# Patient Record
Sex: Female | Born: 1975 | Race: Asian | Hispanic: No | Marital: Single | Smoking: Never smoker
Health system: Southern US, Community
[De-identification: ages and names within clinical notes are randomized; demographics above are authoritative.]

## PROBLEM LIST (undated history)

## (undated) DIAGNOSIS — Z789 Other specified health status: Secondary | ICD-10-CM

## (undated) HISTORY — PX: ECTOPIC PREGNANCY SURGERY: SHX613

---

## 1998-06-12 ENCOUNTER — Other Ambulatory Visit: Admission: RE | Admit: 1998-06-12 | Discharge: 1998-06-12 | Payer: Self-pay | Admitting: *Deleted

## 1999-05-14 ENCOUNTER — Other Ambulatory Visit: Admission: RE | Admit: 1999-05-14 | Discharge: 1999-05-14 | Payer: Self-pay | Admitting: Orthopedic Surgery

## 2004-03-02 ENCOUNTER — Inpatient Hospital Stay (HOSPITAL_COMMUNITY): Admission: AD | Admit: 2004-03-02 | Discharge: 2004-03-02 | Payer: Self-pay | Admitting: *Deleted

## 2004-03-02 ENCOUNTER — Ambulatory Visit: Payer: Self-pay | Admitting: Family Medicine

## 2004-03-07 ENCOUNTER — Ambulatory Visit: Payer: Self-pay | Admitting: Family Medicine

## 2004-03-08 ENCOUNTER — Ambulatory Visit (HOSPITAL_COMMUNITY): Admission: RE | Admit: 2004-03-08 | Discharge: 2004-03-08 | Payer: Self-pay | Admitting: *Deleted

## 2004-03-21 ENCOUNTER — Ambulatory Visit: Payer: Self-pay | Admitting: Obstetrics & Gynecology

## 2004-04-04 ENCOUNTER — Ambulatory Visit: Payer: Self-pay | Admitting: *Deleted

## 2004-04-26 ENCOUNTER — Ambulatory Visit: Payer: Self-pay | Admitting: Family Medicine

## 2004-04-26 ENCOUNTER — Inpatient Hospital Stay (HOSPITAL_COMMUNITY): Admission: RE | Admit: 2004-04-26 | Discharge: 2004-05-02 | Payer: Self-pay | Admitting: *Deleted

## 2004-05-09 ENCOUNTER — Ambulatory Visit: Payer: Self-pay | Admitting: Obstetrics & Gynecology

## 2004-05-16 ENCOUNTER — Ambulatory Visit: Payer: Self-pay | Admitting: *Deleted

## 2004-05-30 ENCOUNTER — Ambulatory Visit: Payer: Self-pay | Admitting: *Deleted

## 2004-06-06 ENCOUNTER — Ambulatory Visit: Payer: Self-pay | Admitting: *Deleted

## 2004-06-06 ENCOUNTER — Inpatient Hospital Stay (HOSPITAL_COMMUNITY): Admission: RE | Admit: 2004-06-06 | Discharge: 2004-06-06 | Payer: Self-pay | Admitting: Obstetrics & Gynecology

## 2004-06-13 ENCOUNTER — Ambulatory Visit: Payer: Self-pay | Admitting: *Deleted

## 2004-06-20 ENCOUNTER — Ambulatory Visit: Payer: Self-pay | Admitting: Obstetrics and Gynecology

## 2004-06-20 ENCOUNTER — Encounter (INDEPENDENT_AMBULATORY_CARE_PROVIDER_SITE_OTHER): Payer: Self-pay | Admitting: *Deleted

## 2004-06-20 ENCOUNTER — Inpatient Hospital Stay (HOSPITAL_COMMUNITY): Admission: AD | Admit: 2004-06-20 | Discharge: 2004-06-22 | Payer: Self-pay | Admitting: Obstetrics and Gynecology

## 2006-12-08 ENCOUNTER — Ambulatory Visit (HOSPITAL_COMMUNITY): Admission: RE | Admit: 2006-12-08 | Discharge: 2006-12-08 | Payer: Self-pay | Admitting: Obstetrics and Gynecology

## 2010-08-17 NOTE — Discharge Summary (Signed)
NAMEPAIGE, Lydia Williams                    ACCOUNT NO.:  [id number]   MEDICAL RECORD NO.:  [id number]          PATIENT TYPE:  WOC   LOCATION:  WOC                          FACILITY:  [facility]   PHYSICIAN:  Jon Gills, M.D.  DATE OF BIRTH:  Dec 26, 1975   DATE OF ADMISSION:  04/26/2004  DATE OF DISCHARGE:  05/02/2004                                 DISCHARGE SUMMARY   ADMISSION DIAGNOSES:  39.  A 35 year old G1 at 27-1/7 weeks with cervical effacement and dilation      on ultrasound.  2.  Reassuring felt heart tracing x2.  3.  Twin gestation.  4.  History of GBS bacteria.  5.  Failed one hour GCT with normal glycemia after betamethasone.   DISCHARGE DIAGNOSES:  62.  A 35 year old G1 at 28-0/7 weeks with twin gestation.  2.  Improved cervical exam with bedrest, antibiotics and tocolytics.  3.  Reassuring fetal heart tracing x2.  4.  Failed one hour GCT with normal glycemia after betamethasone.   DISCHARGE MEDICATIONS:  1.  Prenatal vitamins one p.o. daily.  2.  Procardia XL 30 mg one p.o. b.i.d.   ADMISSION HISTORY:  Ms. Schwartzman was admitted after an ultrasound for growth  showed a cervical length of 0.9 cm with 2.7 cm dilation.  She is admitted  for bedrest, antibiotics, tocolytics and betamethasone.   HOSPITAL COURSE:  Ms. Nisley was initially put on magnesium and had occasional  contractions, mostly in the evening.  She had no cervical change throughout  her hospitalization.  Magnesium was discontinued after three days, and she  was switched to Procardia.  Again, her cervical exam did not become more  dilated or effaced.  After six days of Unasyn, this was discontinued.  She  remained stable up until her discharge.   DISCHARGE INSTRUCTIONS:  The patient was told of her above medical regimen.  She will need to be on strict bed rest and pelvic rest.  She has an  appointment with [facility] in one week.  At that time, she will need  a three hour GTT.     LC/MEDQ  D:  05/02/2004  T:   05/02/2004  Job:  [id number]   cc:   Lesly Dukes, M.D.   [facility], [facility]

## 2010-08-17 NOTE — Op Note (Signed)
NAMEMAELEE, Lydia Williams                    ACCOUNT NO.:  [id number]   MEDICAL RECORD NO.:  [id number]          PATIENT TYPE:  INP   LOCATION:  9373                          [facility]ILITY[facility]   PHYSICIAN:  Phil D. Okey Dupre, M.D.     DATE OF BIRTH:  08-26-1975   DATE OF PROCEDURE:  06/20/2004  DATE OF DISCHARGE:                                 OPERATIVE REPORT   PROCEDURES:  1.  Examination under anesthesia.  2.  Drainage of labial hematoma.  3.  Repair of vaginal laceration.   PREOPERATIVE DIAGNOSIS:  Vaginal laceration and right labial hematoma.   POSTOPERATIVE DIAGNOSIS:  Vaginal laceration and right labial hematoma.   ANESTHESIA:  General.   ESTIMATED BLOOD LOSS:  100 mL.   POSTOPERATIVE CONDITION:  Satisfactory.   SURGEON:  Javier Glazier. Okey Dupre, M.D.   OPERATIVE FINDINGS:  The patient, who had delivered twins without anesthesia  and continued to bleed.  Examination and repair of a vaginal laceration was  unsatisfactory because of the movement and discomfort of the patient, who  continued to bleed after an attempt using local anesthesia to control the  laceration and bleeding.  The patient was taken to the operating room,  examined under general anesthesia, as we did not have a platelet count  available as yet, and she was found to have a right labial tear inside the  hymenal ring with formation of a hematoma in the right labia that probably  had 100-150 mL of blood in it, and a first degree perineal laceration, which  was repaired but reopened when we were doing the examination to make sure  there was no other laceration to the cervix, and the rest of the vagina  seemed normal.  She had two small paraurethral lacerations that were not  bleeding.   Using a finger, the hematoma was evacuated and a deep figure-of-eight used  to control the deep bleeding in the laceration on the right side and then  run with a continuous running 2-0 chromic catgut suture.  This was also done  with respect to the  first degree oozing perineal laceration.  It was closed  as a usual episiotomy closure.  There was very little bleeding at the end of  the procedure, however not much clotting.  A DIC panel was drawn prior to  the surgery and is pending, and the patient will continue to be observed.      PDR/MEDQ  D:  06/20/2004  T:  06/21/2004  Job:  [id number]

## 2010-09-06 ENCOUNTER — Other Ambulatory Visit: Payer: Self-pay | Admitting: Obstetrics and Gynecology

## 2010-09-06 ENCOUNTER — Other Ambulatory Visit (HOSPITAL_COMMUNITY)
Admission: RE | Admit: 2010-09-06 | Discharge: 2010-09-06 | Disposition: A | Discharge status: Home / Self Care | Payer: BC Managed Care – PPO | Source: Ambulatory Visit | Attending: Obstetrics and Gynecology | Admitting: Obstetrics and Gynecology

## 2010-09-06 DIAGNOSIS — Z01419 Encounter for gynecological examination (general) (routine) without abnormal findings: Secondary | ICD-10-CM | POA: Insufficient documentation

## 2010-09-06 DIAGNOSIS — Z113 Encounter for screening for infections with a predominantly sexual mode of transmission: Secondary | ICD-10-CM | POA: Insufficient documentation

## 2010-10-17 ENCOUNTER — Other Ambulatory Visit: Payer: Self-pay | Admitting: Obstetrics and Gynecology

## 2010-10-17 DIAGNOSIS — O09529 Supervision of elderly multigravida, unspecified trimester: Secondary | ICD-10-CM

## 2010-10-17 DIAGNOSIS — O280 Abnormal hematological finding on antenatal screening of mother: Secondary | ICD-10-CM

## 2010-10-26 ENCOUNTER — Encounter (HOSPITAL_COMMUNITY): Payer: Self-pay

## 2010-10-26 ENCOUNTER — Other Ambulatory Visit: Payer: Self-pay | Admitting: Obstetrics and Gynecology

## 2010-10-26 ENCOUNTER — Ambulatory Visit (HOSPITAL_COMMUNITY): Admission: RE | Admit: 2010-10-26 | Payer: BC Managed Care – PPO | Source: Ambulatory Visit

## 2010-10-26 ENCOUNTER — Ambulatory Visit (HOSPITAL_COMMUNITY)
Admission: RE | Admit: 2010-10-26 | Discharge: 2010-10-26 | Disposition: A | Discharge status: Home / Self Care | Payer: BC Managed Care – PPO | Source: Ambulatory Visit | Attending: Obstetrics and Gynecology | Admitting: Obstetrics and Gynecology

## 2010-10-26 DIAGNOSIS — O280 Abnormal hematological finding on antenatal screening of mother: Secondary | ICD-10-CM

## 2010-10-26 DIAGNOSIS — O351XX0 Maternal care for (suspected) chromosomal abnormality in fetus, not applicable or unspecified: Secondary | ICD-10-CM | POA: Insufficient documentation

## 2010-10-26 DIAGNOSIS — O289 Unspecified abnormal findings on antenatal screening of mother: Secondary | ICD-10-CM | POA: Insufficient documentation

## 2010-10-26 DIAGNOSIS — O09529 Supervision of elderly multigravida, unspecified trimester: Secondary | ICD-10-CM

## 2010-10-26 DIAGNOSIS — O3510X0 Maternal care for (suspected) chromosomal abnormality in fetus, unspecified, not applicable or unspecified: Secondary | ICD-10-CM | POA: Insufficient documentation

## 2010-10-26 DIAGNOSIS — Z3689 Encounter for other specified antenatal screening: Secondary | ICD-10-CM | POA: Insufficient documentation

## 2010-10-31 ENCOUNTER — Other Ambulatory Visit: Payer: Self-pay

## 2010-11-16 ENCOUNTER — Ambulatory Visit (HOSPITAL_COMMUNITY): Payer: BC Managed Care – PPO | Attending: Obstetrics and Gynecology

## 2010-11-22 ENCOUNTER — Telehealth (HOSPITAL_COMMUNITY): Payer: Self-pay | Admitting: MS"

## 2010-11-22 NOTE — Telephone Encounter (Signed)
Chromosome analysis on products of conception from patient's previous pregnancy indicated Trisomy 13 (47,XX,+13). Discussed results with patient. Gender not disclosed. Discussed that recurrence risk for future pregnancy would be patient's age-related risk for aneuploidy, which at age 35 is approximately 1%. I discussed that screening and testing options would be available to her in a future pregnancy for chromosome conditions. Patient stated that she understood this but is worried that something will go wrong in the next pregnancy. She is not sure if she wants to try again for another pregnancy since this has been difficult.   Ms. Brienza stated that she has had no physical problems. I asked if she was aware that she missed her post-op appointment last week. She stated that she went back to her OB.   Ms. Parkin said that she has good support around her. I offered her information on Heartstrings, Pregnancy and Infant Loss support group. She declined this information. Encouraged her to call if we could help her in any way.

## 2011-01-15 ENCOUNTER — Encounter (HOSPITAL_COMMUNITY): Payer: Self-pay | Admitting: MS"

## 2011-03-06 ENCOUNTER — Other Ambulatory Visit: Payer: Self-pay

## 2014-01-31 ENCOUNTER — Encounter (HOSPITAL_COMMUNITY): Payer: Self-pay | Admitting: MS"

## 2014-02-07 ENCOUNTER — Other Ambulatory Visit: Payer: Self-pay | Admitting: Obstetrics and Gynecology

## 2014-02-08 LAB — CYTOLOGY - PAP

## 2014-02-09 ENCOUNTER — Other Ambulatory Visit (HOSPITAL_COMMUNITY): Payer: Self-pay | Admitting: Obstetrics and Gynecology

## 2014-02-09 DIAGNOSIS — O09521 Supervision of elderly multigravida, first trimester: Secondary | ICD-10-CM

## 2014-02-09 DIAGNOSIS — O09292 Supervision of pregnancy with other poor reproductive or obstetric history, second trimester: Secondary | ICD-10-CM

## 2014-04-01 NOTE — L&D Delivery Note (Signed)
SVD of VMI at 1324 on 09/12/14.  EBL 200cc.  APGARs 9,10.  Placenta to L&D. Head delivered LOA.  Body followed atraumatically.  Cord was clamped, cut and baby to abdomen.  Cord blood was obtained.  Placenta delivered S/I/3VC.  Fundus was firmed with pitocin and massage.  1st degree perineal lac was repaired with 3-0 Rapide in the normal fashion.  Mom and baby stable.  Mitchel Honour, DO

## 2014-04-19 ENCOUNTER — Encounter (HOSPITAL_COMMUNITY): Payer: Self-pay

## 2014-04-19 ENCOUNTER — Ambulatory Visit (HOSPITAL_COMMUNITY)
Admission: RE | Admit: 2014-04-19 | Discharge: 2014-04-19 | Disposition: A | Discharge status: Home / Self Care | Payer: BLUE CROSS/BLUE SHIELD | Source: Ambulatory Visit | Attending: Obstetrics and Gynecology | Admitting: Obstetrics and Gynecology

## 2014-04-19 DIAGNOSIS — Z36 Encounter for antenatal screening of mother: Secondary | ICD-10-CM | POA: Diagnosis present

## 2014-04-19 DIAGNOSIS — O09521 Supervision of elderly multigravida, first trimester: Secondary | ICD-10-CM

## 2014-04-19 DIAGNOSIS — O09212 Supervision of pregnancy with history of pre-term labor, second trimester: Secondary | ICD-10-CM | POA: Diagnosis not present

## 2014-04-19 DIAGNOSIS — O09529 Supervision of elderly multigravida, unspecified trimester: Secondary | ICD-10-CM | POA: Insufficient documentation

## 2014-04-19 DIAGNOSIS — Z3A18 18 weeks gestation of pregnancy: Secondary | ICD-10-CM | POA: Diagnosis not present

## 2014-04-19 DIAGNOSIS — O09292 Supervision of pregnancy with other poor reproductive or obstetric history, second trimester: Secondary | ICD-10-CM | POA: Insufficient documentation

## 2014-04-19 DIAGNOSIS — O09522 Supervision of elderly multigravida, second trimester: Secondary | ICD-10-CM | POA: Insufficient documentation

## 2014-04-19 DIAGNOSIS — O09299 Supervision of pregnancy with other poor reproductive or obstetric history, unspecified trimester: Secondary | ICD-10-CM | POA: Insufficient documentation

## 2014-04-19 DIAGNOSIS — Z3689 Encounter for other specified antenatal screening: Secondary | ICD-10-CM | POA: Insufficient documentation

## 2014-04-20 ENCOUNTER — Other Ambulatory Visit (HOSPITAL_COMMUNITY): Payer: Self-pay

## 2014-08-19 LAB — OB RESULTS CONSOLE GBS: STREP GROUP B AG: NEGATIVE

## 2014-09-12 ENCOUNTER — Encounter (HOSPITAL_COMMUNITY): Payer: Self-pay

## 2014-09-12 ENCOUNTER — Inpatient Hospital Stay (HOSPITAL_COMMUNITY)
Admission: AD | Admit: 2014-09-12 | Discharge: 2014-09-14 | DRG: 775 | Disposition: A | Discharge status: Home / Self Care | Payer: BLUE CROSS/BLUE SHIELD | Source: Ambulatory Visit | Attending: Obstetrics & Gynecology | Admitting: Obstetrics & Gynecology

## 2014-09-12 DIAGNOSIS — IMO0001 Reserved for inherently not codable concepts without codable children: Secondary | ICD-10-CM

## 2014-09-12 DIAGNOSIS — O09523 Supervision of elderly multigravida, third trimester: Secondary | ICD-10-CM | POA: Diagnosis not present

## 2014-09-12 DIAGNOSIS — Z3A38 38 weeks gestation of pregnancy: Secondary | ICD-10-CM | POA: Diagnosis present

## 2014-09-12 DIAGNOSIS — Z3490 Encounter for supervision of normal pregnancy, unspecified, unspecified trimester: Secondary | ICD-10-CM

## 2014-09-12 HISTORY — DX: Other specified health status: Z78.9

## 2014-09-12 LAB — TYPE AND SCREEN
ABO/RH(D): O POS
Antibody Screen: NEGATIVE

## 2014-09-12 LAB — CBC
HCT: 38.3 % (ref 36.0–46.0)
Hemoglobin: 13 g/dL (ref 12.0–15.0)
MCH: 31.3 pg (ref 26.0–34.0)
MCHC: 33.9 g/dL (ref 30.0–36.0)
MCV: 92.1 fL (ref 78.0–100.0)
PLATELETS: 165 10*3/uL (ref 150–400)
RBC: 4.16 MIL/uL (ref 3.87–5.11)
RDW: 14.4 % (ref 11.5–15.5)
WBC: 9.4 10*3/uL (ref 4.0–10.5)

## 2014-09-12 LAB — ABO/RH: ABO/RH(D): O POS

## 2014-09-12 MED ORDER — IBUPROFEN 600 MG PO TABS
600.0000 mg | ORAL_TABLET | Freq: Four times a day (QID) | ORAL | Status: DC
  Administered 2014-09-12 – 2014-09-14 (×8): 600 mg via ORAL
  Filled 2014-09-12 (×10): qty 1

## 2014-09-12 MED ORDER — ZOLPIDEM TARTRATE 5 MG PO TABS: 5.0000 mg | ORAL_TABLET | Freq: Every evening | ORAL | Status: DC | PRN

## 2014-09-12 MED ORDER — SIMETHICONE 80 MG PO CHEW: 80.0000 mg | CHEWABLE_TABLET | ORAL | Status: DC | PRN

## 2014-09-12 MED ORDER — ACETAMINOPHEN 325 MG PO TABS: 650.0000 mg | ORAL_TABLET | ORAL | Status: DC | PRN

## 2014-09-12 MED ORDER — DIBUCAINE 1 % RE OINT: 1.0000 "application " | TOPICAL_OINTMENT | RECTAL | Status: DC | PRN

## 2014-09-12 MED ORDER — BENZOCAINE-MENTHOL 20-0.5 % EX AERO
1.0000 "application " | INHALATION_SPRAY | CUTANEOUS | Status: DC | PRN
  Administered 2014-09-12 – 2014-09-14 (×2): 1 via TOPICAL
  Filled 2014-09-12 (×2): qty 56

## 2014-09-12 MED ORDER — OXYCODONE-ACETAMINOPHEN 5-325 MG PO TABS: 2.0000 | ORAL_TABLET | ORAL | Status: DC | PRN

## 2014-09-12 MED ORDER — LIDOCAINE HCL (PF) 1 % IJ SOLN
30.0000 mL | INTRAMUSCULAR | Status: AC | PRN
  Administered 2014-09-12: 30 mL via SUBCUTANEOUS
  Filled 2014-09-12: qty 30

## 2014-09-12 MED ORDER — TETANUS-DIPHTH-ACELL PERTUSSIS 5-2.5-18.5 LF-MCG/0.5 IM SUSP: 0.5000 mL | Freq: Once | INTRAMUSCULAR | Status: DC

## 2014-09-12 MED ORDER — OXYTOCIN BOLUS FROM INFUSION: 500.0000 mL | INTRAVENOUS | Status: DC

## 2014-09-12 MED ORDER — WITCH HAZEL-GLYCERIN EX PADS: 1.0000 "application " | MEDICATED_PAD | CUTANEOUS | Status: DC | PRN

## 2014-09-12 MED ORDER — PRENATAL MULTIVITAMIN CH
1.0000 | ORAL_TABLET | Freq: Every day | ORAL | Status: DC
  Administered 2014-09-13 – 2014-09-14 (×2): 1 via ORAL
  Filled 2014-09-12 (×2): qty 1

## 2014-09-12 MED ORDER — LACTATED RINGERS IV SOLN: 500.0000 mL | INTRAVENOUS | Status: DC | PRN

## 2014-09-12 MED ORDER — EPHEDRINE 5 MG/ML INJ
10.0000 mg | INTRAVENOUS | Status: DC | PRN
  Filled 2014-09-12: qty 2

## 2014-09-12 MED ORDER — FLEET ENEMA 7-19 GM/118ML RE ENEM: 1.0000 | ENEMA | RECTAL | Status: DC | PRN

## 2014-09-12 MED ORDER — ONDANSETRON HCL 4 MG/2ML IJ SOLN: 4.0000 mg | INTRAMUSCULAR | Status: DC | PRN

## 2014-09-12 MED ORDER — PHENYLEPHRINE 40 MCG/ML (10ML) SYRINGE FOR IV PUSH (FOR BLOOD PRESSURE SUPPORT)
80.0000 ug | PREFILLED_SYRINGE | INTRAVENOUS | Status: DC | PRN
  Filled 2014-09-12: qty 2

## 2014-09-12 MED ORDER — FENTANYL 2.5 MCG/ML BUPIVACAINE 1/10 % EPIDURAL INFUSION (WH - ANES): 14.0000 mL/h | INTRAMUSCULAR | Status: DC | PRN

## 2014-09-12 MED ORDER — DIPHENHYDRAMINE HCL 25 MG PO CAPS: 25.0000 mg | ORAL_CAPSULE | Freq: Four times a day (QID) | ORAL | Status: DC | PRN

## 2014-09-12 MED ORDER — OXYTOCIN 40 UNITS IN LACTATED RINGERS INFUSION - SIMPLE MED
62.5000 mL/h | INTRAVENOUS | Status: DC
  Administered 2014-09-12: 62.5 mL/h via INTRAVENOUS
  Filled 2014-09-12 (×2): qty 1000

## 2014-09-12 MED ORDER — ONDANSETRON HCL 4 MG/2ML IJ SOLN: 4.0000 mg | Freq: Four times a day (QID) | INTRAMUSCULAR | Status: DC | PRN

## 2014-09-12 MED ORDER — OXYCODONE-ACETAMINOPHEN 5-325 MG PO TABS
2.0000 | ORAL_TABLET | ORAL | Status: DC | PRN
Start: 2014-09-12 — End: 2014-09-12

## 2014-09-12 MED ORDER — OXYCODONE-ACETAMINOPHEN 5-325 MG PO TABS: 1.0000 | ORAL_TABLET | ORAL | Status: DC | PRN

## 2014-09-12 MED ORDER — CITRIC ACID-SODIUM CITRATE 334-500 MG/5ML PO SOLN: 30.0000 mL | ORAL | Status: DC | PRN

## 2014-09-12 MED ORDER — LANOLIN HYDROUS EX OINT: TOPICAL_OINTMENT | CUTANEOUS | Status: DC | PRN

## 2014-09-12 MED ORDER — LACTATED RINGERS IV SOLN: INTRAVENOUS | Status: DC

## 2014-09-12 MED ORDER — ONDANSETRON HCL 4 MG PO TABS: 4.0000 mg | ORAL_TABLET | ORAL | Status: DC | PRN

## 2014-09-12 MED ORDER — OXYCODONE-ACETAMINOPHEN 5-325 MG PO TABS
1.0000 | ORAL_TABLET | ORAL | Status: DC | PRN
  Administered 2014-09-12 (×2): 1 via ORAL
  Filled 2014-09-12 (×2): qty 1

## 2014-09-12 MED ORDER — SENNOSIDES-DOCUSATE SODIUM 8.6-50 MG PO TABS
2.0000 | ORAL_TABLET | ORAL | Status: DC
  Administered 2014-09-12 – 2014-09-14 (×2): 2 via ORAL
  Filled 2014-09-12 (×2): qty 2

## 2014-09-12 MED ORDER — DIPHENHYDRAMINE HCL 50 MG/ML IJ SOLN: 12.5000 mg | INTRAMUSCULAR | Status: DC | PRN

## 2014-09-12 NOTE — Progress Notes (Signed)
Called Heather RN BS Charge with report received room assignment of 160.

## 2014-09-12 NOTE — MAU Note (Signed)
Notified Dr. Mitchel Honour patient G5P3 [redacted]w[redacted]d, labor cervical exam 4.5/80/-3 bulging membranes, onset earlier this morning, some bloody show, Ucs every 5 minutes strong, patient considering epidural, received admit orders.

## 2014-09-12 NOTE — H&P (Signed)
Lydia Williams is a 39 y.o. female presenting for labor.  CTX started this AM between 6 and 7.  No LOF.  Bloody show.  Active FM.  Antepartum course complicated by AMA with h/o Tri 13; Panorama wnl.  GBS negative.  Maternal Medical History:  Reason for admission: Contractions.   Contractions: Onset was 3-5 hours ago.   Frequency: regular.   Perceived severity is moderate.    Fetal activity: Perceived fetal activity is normal.   Last perceived fetal movement was within the past hour.    Prenatal complications: no prenatal complications Prenatal Complications - Diabetes: none.    OB History    Gravida Para Term Preterm AB TAB SAB Ectopic Multiple Living   5 2 1 1 1  0 0 1 1 3      Past Medical History  Diagnosis Date  . Medical history non-contributory    Past Surgical History  Procedure Laterality Date  . Ectopic pregnancy surgery     Family History: family history is not on file. Social History:  reports that she has never smoked. She does not have any smokeless tobacco history on file. She reports that she does not drink alcohol or use illicit drugs.   Prenatal Transfer Tool  Maternal Diabetes: No Genetic Screening: Normal Maternal Ultrasounds/Referrals: Normal Fetal Ultrasounds or other Referrals:  None Maternal Substance Abuse:  No Significant Maternal Medications:  None Significant Maternal Lab Results:  Lab values include: Group B Strep negative Other Comments:  None  ROS  Dilation: 4.5 Effacement (%): 80 Station: -3 Exam by:: Lydia Reamer RN Blood pressure 111/69, pulse 84, temperature 98 F (36.7 C), resp. rate 16, height 5\' 4"  (1.626 m), weight 181 lb 9.6 oz (82.373 kg), last menstrual period 11/03/2013, unknown if currently breastfeeding. Maternal Exam:  Uterine Assessment: Contraction strength is moderate.  Contraction frequency is regular.   Abdomen: Fundal height is c/w dates.   Estimated fetal weight is 7#8.       Physical Exam  Constitutional: She is  oriented to person, place, and time. She appears well-developed and well-nourished.  GI: Soft. There is no rebound and no guarding.  Neurological: She is alert and oriented to person, place, and time.  Skin: Skin is warm and dry.  Psychiatric: She has a normal mood and affect. Her behavior is normal.    Prenatal labs: ABO, Rh:   Antibody:   Rubella:   RPR:    HBsAg:    HIV:    GBS: Negative (05/20 0000)   Assessment/Plan: 75HY [id number] at [redacted]w[redacted]d with labor -Anticipate NSVD -Epidural if desired   Lydia Williams 09/12/2014, 10:34 AM

## 2014-09-13 LAB — CBC
HCT: 36 % (ref 36.0–46.0)
Hemoglobin: 12.1 g/dL (ref 12.0–15.0)
MCH: 30.9 pg (ref 26.0–34.0)
MCHC: 33.6 g/dL (ref 30.0–36.0)
MCV: 91.8 fL (ref 78.0–100.0)
PLATELETS: 148 10*3/uL — AB (ref 150–400)
RBC: 3.92 MIL/uL (ref 3.87–5.11)
RDW: 14.5 % (ref 11.5–15.5)
WBC: 11.4 10*3/uL — ABNORMAL HIGH (ref 4.0–10.5)

## 2014-09-13 LAB — RPR: RPR: NONREACTIVE
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
## 2014-09-13 NOTE — Progress Notes (Signed)
Post Partum Day 1 Subjective: no complaints, up ad lib, voiding and tolerating PO  Objective: Blood pressure 117/64, pulse 70, temperature 98.1 F (36.7 C), temperature source Oral, resp. rate 16, height 5\' 4"  (1.626 m), weight 181 lb 9.6 oz (82.373 kg), last menstrual period 11/03/2013, SpO2 98 %, unknown if currently breastfeeding.  Physical Exam:  General: alert and cooperative Lochia: appropriate Uterine Fundus: firm Incision: healing well DVT Evaluation: No evidence of DVT seen on physical exam. Negative Homan's sign. No cords or calf tenderness. No significant calf/ankle edema.   Recent Labs  09/12/14 1005 09/13/14 0500  HGB 13.0 12.1  HCT 38.3 36.0    Assessment/Plan: Plan for discharge tomorrow Does not desire circ  LOS: 1 day   Zephyr Sausedo G 09/13/2014, 8:05 AM

## 2014-09-13 NOTE — Lactation Note (Addendum)
This note was copied from the chart of Lydia Williams. Lactation Consultation Note First time BF mom has 3 other children but didn't BF them. Mom has large everted nipples, baby tends to slide down to just on nipple from deep latch frequently. Reviewed w/mom positions, stated RN has helped her a lot w/BF so far. Romilda Garret RN has been working w/mom and supporting her to BF and not give as much formula. Mom wants to supplement as well. Discussed how supplementing can cut back her milk supply and to offer breast first.  Mom states baby is cluster feeding, explained reasoning of cluster feeding. Encouraged mom to massage breast during BF to assist in expressing colostrum so baby will get more. Mom encouraged to feed baby 8-12 times/24 hours and with feeding cues. Mom encouraged to waken baby for feeds. Mom encouraged to do skin-to-skin.Referred to Baby and Me Book in Breastfeeding section Pg. 22-23 for position options and Proper latch demonstration.[facility] brochure given w/resources, support groups and LC services. Educated about newborn behavior.  Noted during suck assessment while mom went to bathroom, baby had cried noted a white vertical line in upper hard palate. On assessment the line has a hard vertical ridge. Noted upper lip labial frenulum and tongue has limited movement. Has coorrdinated suck. Reported to nursery RN. Patient Name: Lydia Williams TIRWE'R Date: 09/13/2014 Reason for consult: Initial assessment   Maternal Data  Has patient been taught Hand Expression?: Yes Does the patient have breastfeeding experience prior to this delivery?: No  Feeding Feeding Type: Breast Fed Length of feed: 5 min (still BF)  LATCH Score/Interventions Latch: Repeated attempts needed to sustain latch, nipple held in mouth throughout feeding, stimulation needed to elicit sucking reflex. Intervention(s): Assist with latch;Breast massage;Breast compression  Audible Swallowing: A few with  stimulation Intervention(s): Skin to skin;Hand expression;Alternate breast massage  Type of Nipple: Everted at rest and after stimulation  Comfort (Breast/Nipple): Soft / non-tender     Hold (Positioning): Assistance needed to correctly position infant at breast and maintain latch. Intervention(s): Breastfeeding basics reviewed;Support Pillows;Position options;Skin to skin  LATCH Score: 7  Lactation Tools Discussed/Used     Consult Status Consult Status: Follow-up Date: 09/14/14 Follow-up type: In-patient    Charyl Dancer 09/13/2014, 5:52 PM

## 2014-09-14 MED ORDER — IBUPROFEN 600 MG PO TABS
600.0000 mg | ORAL_TABLET | Freq: Four times a day (QID) | ORAL | Status: AC
Start: 2014-09-14 — End: ?
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
## 2014-09-14 NOTE — Discharge Summary (Signed)
Obstetric Discharge Summary Reason for Admission: onset of labor Prenatal Procedures: ultrasound Intrapartum Procedures: spontaneous vaginal delivery Postpartum Procedures: none Complications-Operative and Postpartum: 1 degree perineal laceration HEMOGLOBIN  Date Value Ref Range Status  09/13/2014 12.1 12.0 - 15.0 g/dL Final   HCT  Date Value Ref Range Status  09/13/2014 36.0 36.0 - 46.0 % Final    Physical Exam:  General: alert and cooperative Lochia: appropriate Uterine Fundus: firm Incision: healing well DVT Evaluation: No evidence of DVT seen on physical exam. Negative Homan's sign. No cords or calf tenderness. No significant calf/ankle edema.  Discharge Diagnoses: Term Pregnancy-delivered  Discharge Information: Date: 09/14/2014 Activity: pelvic rest Diet: routine Medications: PNV and Ibuprofen Condition: stable Instructions: refer to practice specific booklet Discharge to: home   Newborn Data: Live born female  Birth Weight: 8 lb 9 oz (3884 g) APGAR: 9, 10  Home with mother.  Corrie Brannen G 09/14/2014, 8:28 AM

## 2014-09-14 NOTE — Lactation Note (Signed)
This note was copied from the chart of Lydia Sheny Calendine. Lactation Consultation Note  Breast and formula.  Mother states she has "no milk". Reviewed hand expression with mother who was happy to express good flow of transitional flow.  Reviewed volume guidelines and engorgement care. Provided mother with a hand pump.   Patient Name: Lydia Williams GPQDI'Y Date: 09/14/2014 Reason for consult: Follow-up assessment   Maternal Data    Feeding Feeding Type: Bottle Fed - Formula Length of feed: 30 min  LATCH Score/Interventions                      Lactation Tools Discussed/Used     Consult Status Consult Status: Complete    Lydia Williams 09/14/2014, 9:53 AM

## 2014-10-18 ENCOUNTER — Other Ambulatory Visit: Payer: Self-pay | Admitting: Obstetrics and Gynecology

## 2014-10-20 LAB — CYTOLOGY - PAP

## 2016-12-12 IMAGING — US US OB DETAIL+14 WK
1 series · 12 of 28 positions shown · non-contrast
Comparison: none

[Series 1: us ob detail+14 wk · 0.23mm/px · 12 of 119 slices shown]
[im 5/119]
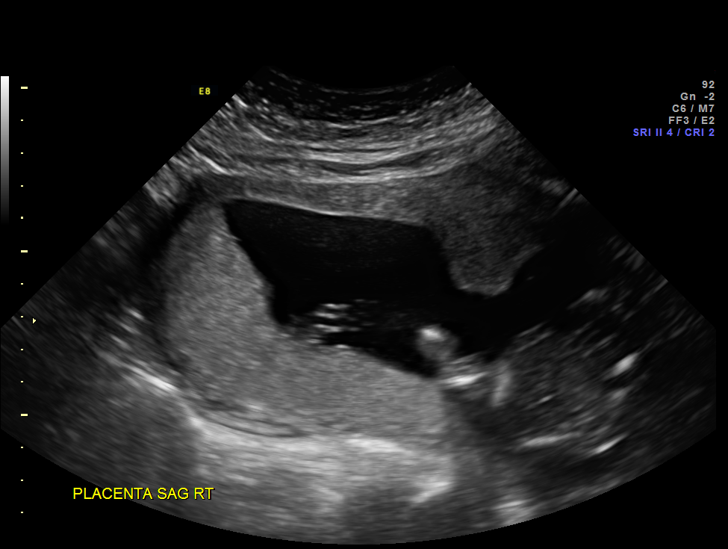
[im 14/119]
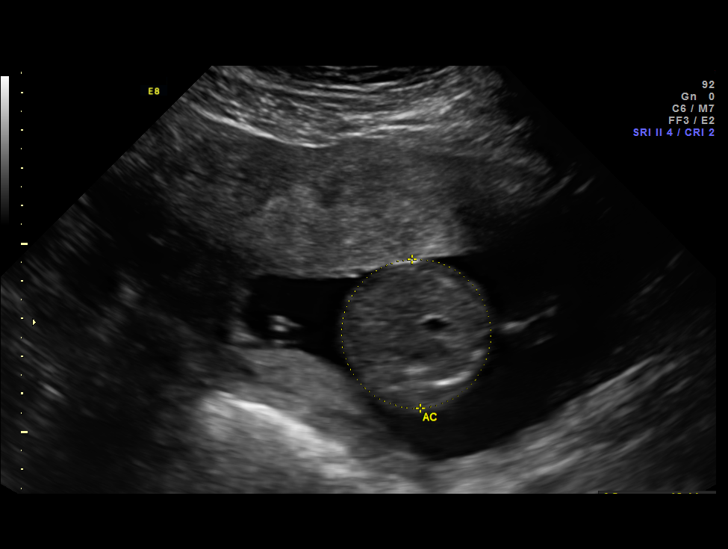
[im 22/119]
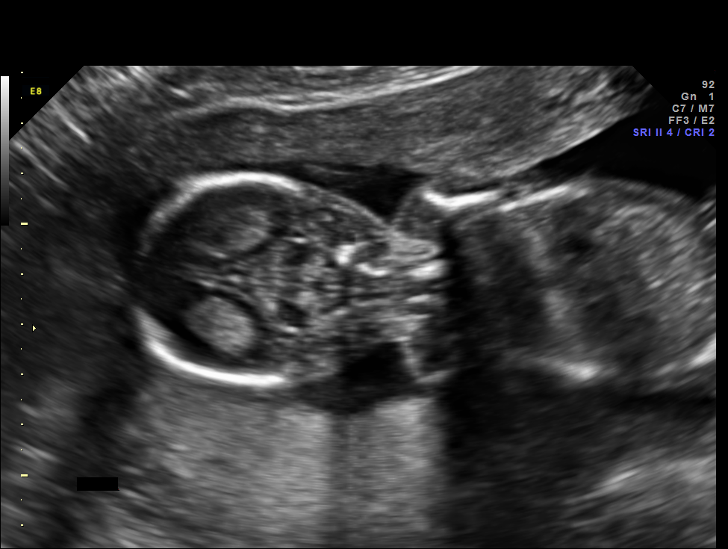
[im 35/119]
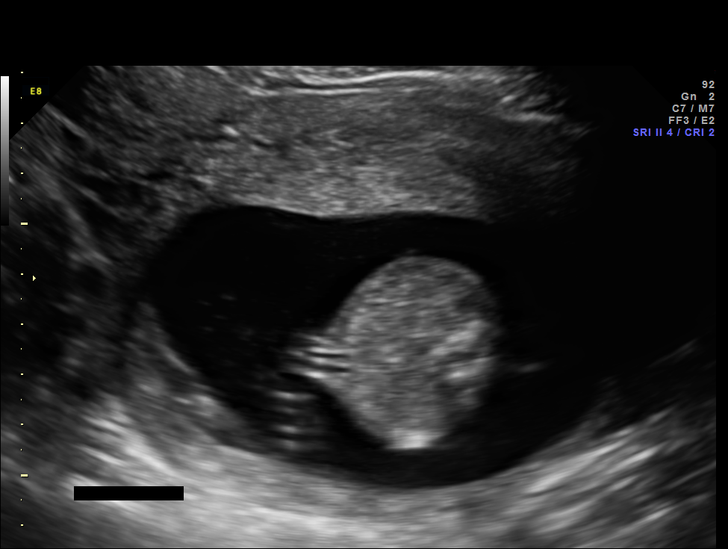
[im 44/119]
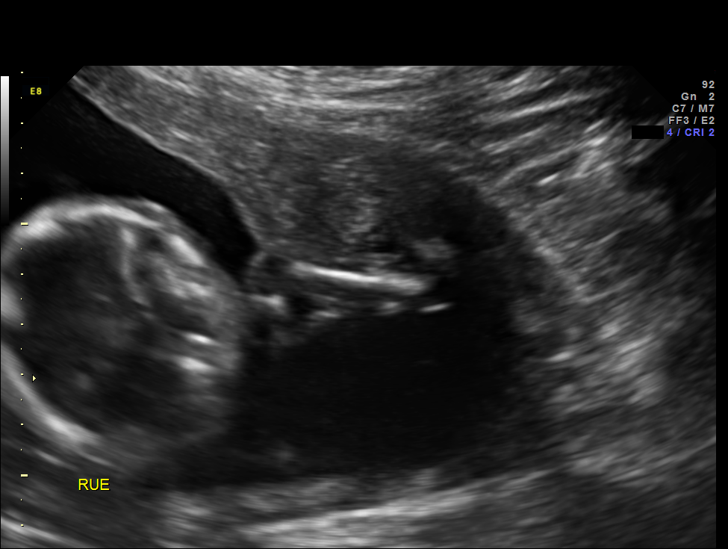
[im 53/119]
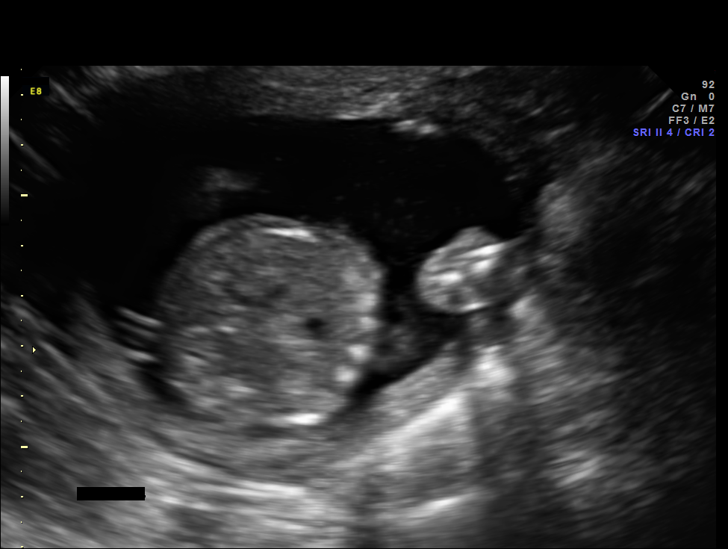
[im 66/119]
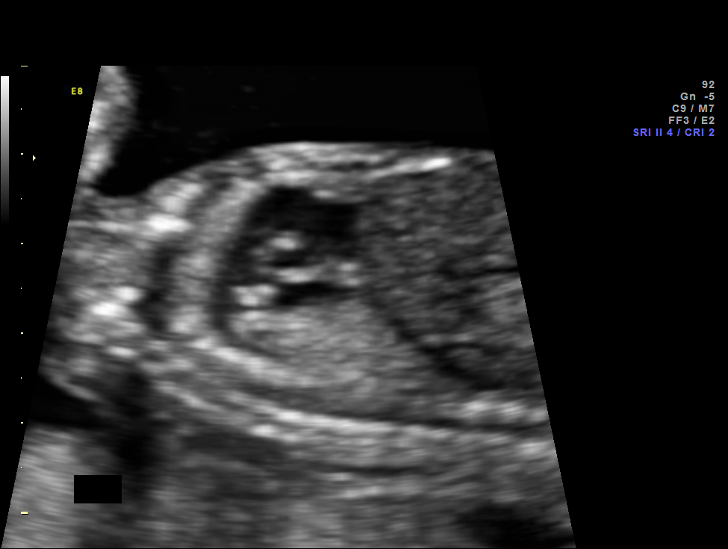
[im 75/119]
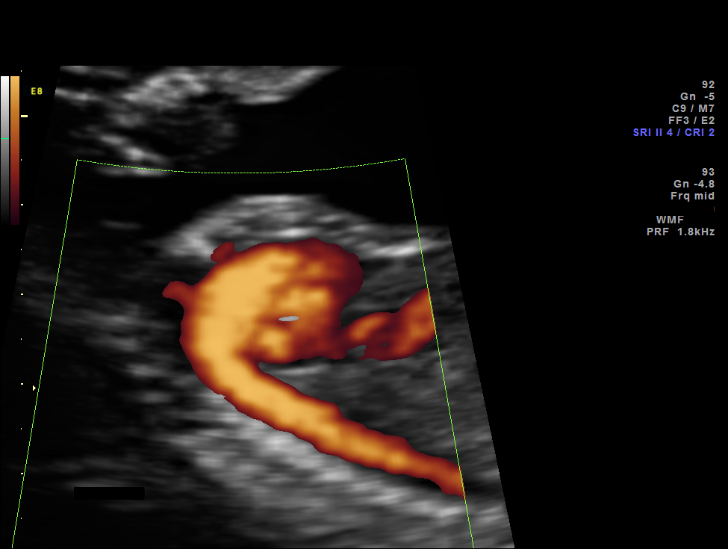
[im 84/119]
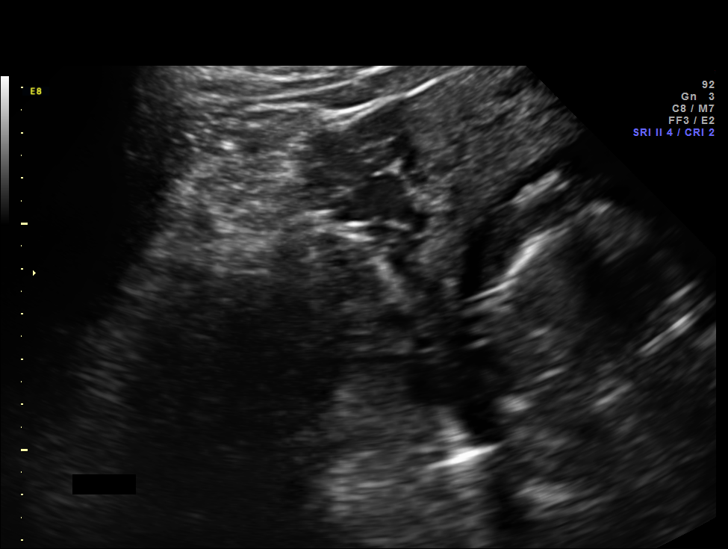
[im 97/119]
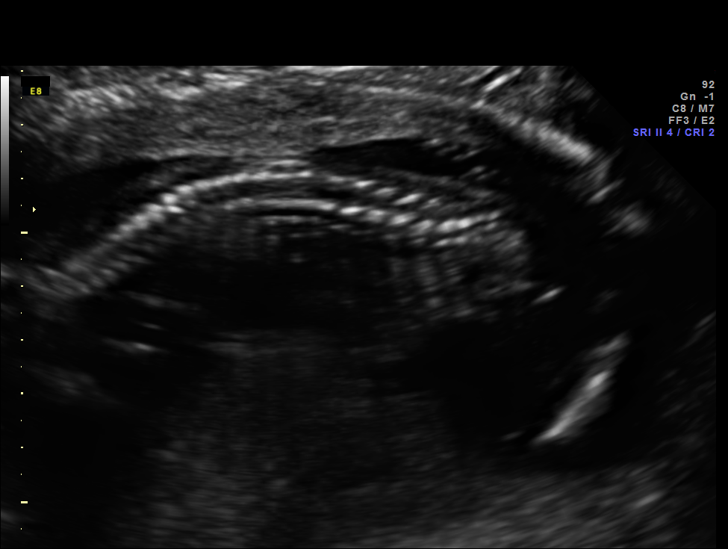
[im 105/119]
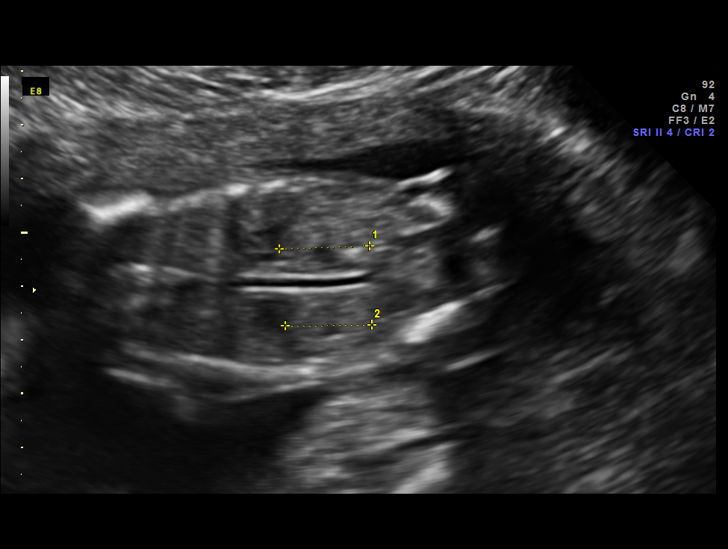
[im 114/119]
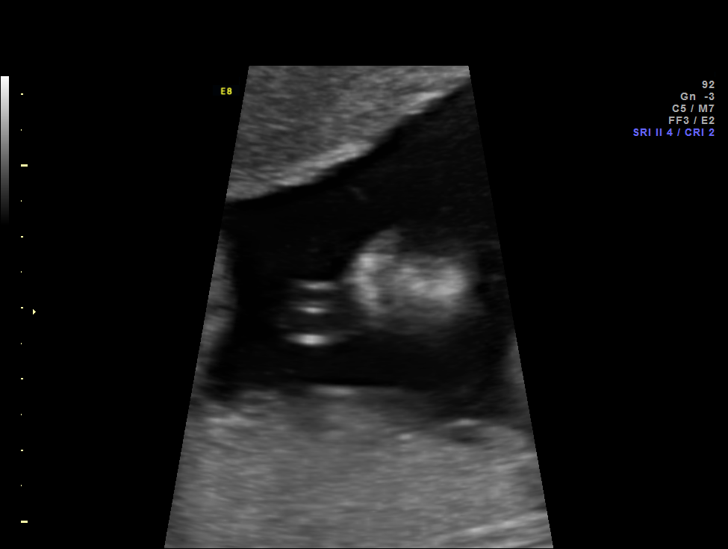

[12 of 28 positions shown; findings below may reference images not displayed]

OBSTETRICS REPORT
                      (Signed Final 04/19/2014 [DATE])

Service(s) Provided

 US OB DETAIL + 14 WK                                  76811.0
Indications

 Advanced maternal age multigravida 34+, second
 trimester
 Prior poor obstetrical history antepartum, second
 trimester T-13; low risk NIPS
 Detailed fetal anatomic survey                        Z36
 Previous pre-term deliveries  35 week twins
 18 weeks gestation of pregnancy
Fetal Evaluation

 Num Of Fetuses:    1
 Fetal Heart Rate:  152                          bpm
 Cardiac Activity:  Observed
 Presentation:      Breech
 Placenta:          Posterior, above cervical
                    os
 P. Cord            Visualized, central
 Insertion:

 Amniotic Fluid
 AFI FV:      Subjectively within normal limits
                                             Larg Pckt:    4.76  cm
Biometry

 BPD:     42.5  mm     G. Age:  18w 6d                CI:         76.4   70 - 86
 OFD:     55.6  mm                                    FL/HC:      17.8   15.8 -
                                                                         18
 HC:     156.4  mm     G. Age:  18w 4d       69  %    HC/AC:      1.20   1.07 -

 AC:     130.2  mm     G. Age:  18w 4d       66  %    FL/BPD:
 FL:      27.9  mm     G. Age:  18w 4d       64  %    FL/AC:      21.4   20 - 24
 HUM:     26.5  mm     G. Age:  18w 2d       67  %
 CER:     19.1  mm     G. Age:  18w 4d       67  %
 NFT:      3.6  mm

 Est. FW:     246  gm      0 lb 9 oz     58  %
Gestational Age

 LMP:           23w 6d        Date:  11/03/13                 EDD:   08/10/14
 U/S Today:     18w 4d                                        EDD:   09/16/14
 Best:          18w 0d     Det. By:  Early Ultrasound         EDD:   09/20/14
                                     (01/26/14)
Anatomy

 Cranium:          Appears normal         Aortic Arch:      Appears normal
 Fetal Cavum:      Appears normal         Ductal Arch:      Appears normal
 Ventricles:       Appears normal         Diaphragm:        Appears normal
 Choroid Plexus:   Appears normal         Stomach:          Appears normal, left
                                                            sided
 Cerebellum:       Appears normal         Abdomen:          Appears normal
 Posterior Fossa:  Appears normal         Abdominal Wall:   Appears nml (cord
                                                            insert, abd wall)
 Nuchal Fold:      Appears normal         Cord Vessels:     Appears normal (3
                                                            vessel cord)
 Face:             Appears normal         Kidneys:          Appear normal
                   (orbits and profile)
 Lips:             Appears normal         Bladder:          Appears normal
 Heart:            Appears normal         Spine:            Appears normal
                   (4CH, axis, and
                   situs)
 RVOT:             Appears normal         Lower             Appears normal
                                          Extremities:
 LVOT:             Appears normal         Upper             Appears normal
                                          Extremities:

 Other:  Nasal bone visualized. Male gender.
Targeted Anatomy

 Fetal Central Nervous System
 Cisterna Magna:
Cervix Uterus Adnexa

 Cervical Length:    4.1      cm

 Cervix:       Normal appearance by transabdominal scan.
 Left Ovary:    Within normal limits.
 Right Ovary:   Not visualized.
Impression

 SIUP at 18+0 weeks
 Normal detailed fetal anatomy
 Markers of aneuploidy: none
 Normal amniotic fluid volume
 Measurements consistent with early US
Recommendations

 Follow-up ultrasounds as clinically indicated.

 questions or concerns.
